# Patient Record
Sex: Female | Born: 1940 | Race: White | Hispanic: No | Smoking: Never smoker
Health system: Southern US, Community
[De-identification: ages and names within clinical notes are randomized; demographics above are authoritative.]

## PROBLEM LIST (undated history)

## (undated) DIAGNOSIS — Z86718 Personal history of other venous thrombosis and embolism: Secondary | ICD-10-CM

## (undated) DIAGNOSIS — C189 Malignant neoplasm of colon, unspecified: Secondary | ICD-10-CM

## (undated) DIAGNOSIS — K219 Gastro-esophageal reflux disease without esophagitis: Secondary | ICD-10-CM

## (undated) DIAGNOSIS — I10 Essential (primary) hypertension: Secondary | ICD-10-CM

## (undated) DIAGNOSIS — E079 Disorder of thyroid, unspecified: Secondary | ICD-10-CM

## (undated) DIAGNOSIS — N390 Urinary tract infection, site not specified: Secondary | ICD-10-CM

## (undated) HISTORY — PX: COLON SURGERY: SHX602

## (undated) HISTORY — PX: ABDOMINAL HYSTERECTOMY: SHX81

---

## 2013-09-20 ENCOUNTER — Emergency Department (INDEPENDENT_AMBULATORY_CARE_PROVIDER_SITE_OTHER)
Admission: EM | Admit: 2013-09-20 | Discharge: 2013-09-20 | Disposition: A | Discharge status: Home / Self Care | Payer: Medicare Other | Source: Home / Self Care | Attending: Family Medicine | Admitting: Family Medicine

## 2013-09-20 ENCOUNTER — Encounter: Payer: Self-pay | Admitting: Emergency Medicine

## 2013-09-20 DIAGNOSIS — N3000 Acute cystitis without hematuria: Secondary | ICD-10-CM

## 2013-09-20 HISTORY — DX: Personal history of other venous thrombosis and embolism: Z86.718

## 2013-09-20 HISTORY — DX: Malignant neoplasm of colon, unspecified: C18.9

## 2013-09-20 HISTORY — DX: Disorder of thyroid, unspecified: E07.9

## 2013-09-20 HISTORY — DX: Essential (primary) hypertension: I10

## 2013-09-20 LAB — POCT URINALYSIS DIP (MANUAL ENTRY)
Bilirubin, UA: NEGATIVE
Glucose, UA: NEGATIVE
Ketones, POC UA: NEGATIVE
Nitrite, UA: POSITIVE
Spec Grav, UA: >=1.03 (ref 1.005–1.03)

## 2013-09-20 MED ORDER — CIPROFLOXACIN HCL 500 MG PO TABS: 500.0000 mg | ORAL_TABLET | Freq: Two times a day (BID) | ORAL | Status: DC

## 2013-09-20 NOTE — ED Notes (Signed)
Patient c/o frequent urination, dysuria and burning with urination x 4 days. Patient has tried OTC AZO patient also states she gets UTIs frequent.

## 2013-09-20 NOTE — ED Provider Notes (Signed)
CSN: [id number]     Arrival date & time 09/20/13  1656 History   First MD Initiated Contact with Patient 09/20/13 1722     Chief Complaint  Patient presents with  . Dysuria  . Urinary Frequency      HPI Comments: Patient complains of four day history of dysuria, urgency, frequency, hesitancy, and nocturia.  No fevers, chills, and sweats.  No abdominal pain.  She states that she gets a UTI about every 3 months.  Her last UTI was treated with Cipro, which she states seems to work the best.  Her symptoms have been improved with OTC AZO.  Patient is a 72 y.o. female presenting with dysuria. The history is provided by the patient.  Dysuria Pain quality:  Burning Pain severity:  Mild Onset quality:  Sudden Duration:  4 days Timing:  Constant Progression:  Worsening Chronicity:  Recurrent Recent urinary tract infections: yes   Relieved by:  Phenazopyridine Worsened by:  Nothing tried Ineffective treatments:  None tried Urinary symptoms: frequent urination and hesitancy   Urinary symptoms: no discolored urine, no foul-smelling urine, no hematuria and no bladder incontinence   Associated symptoms: no abdominal pain, no fever, no flank pain, no genital lesions, no nausea, no vaginal discharge and no vomiting   Risk factors: recurrent urinary tract infections     Past Medical History  Diagnosis Date  . Hypertension   . Thyroid disease   . H/O blood clots   . Colon cancer    Past Surgical History  Procedure Laterality Date  . Abdominal hysterectomy    . Colon surgery     History reviewed. No pertinent family history. History  Substance Use Topics  . Smoking status: Never Smoker   . Smokeless tobacco: Never Used  . Alcohol Use: No   OB History   Grav Para Term Preterm Abortions TAB SAB Ect Mult Living                 Review of Systems  Constitutional: Negative for fever.  Gastrointestinal: Negative for nausea, vomiting and abdominal pain.  Genitourinary: Positive for  dysuria. Negative for flank pain and vaginal discharge.  All other systems reviewed and are negative.    Allergies  Review of patient's allergies indicates no known allergies.  Home Medications   Current Outpatient Rx  Name  Route  Sig  Dispense  Refill  . ciprofloxacin (CIPRO) 500 MG tablet   Oral   Take 1 tablet (500 mg total) by mouth 2 (two) times daily.   14 tablet   0    BP 118/78  Pulse 89  Temp(Src) 98.1 F (36.7 C) (Oral)  Resp 18  Ht 5' 4.5" (1.638 m)  Wt 240 lb (108.863 kg)  BMI 40.57 kg/m2  SpO2 97% Physical Exam Nursing notes and Vital Signs reviewed. Appearance:  Patient appears stated age, and in no acute distress.  Patient is obese (BMI 40.6) Eyes:  Pupils are equal, round, and reactive to light and accomodation.  Extraocular movement is intact.  Conjunctivae are not inflamed  Pharynx:  Normal; moist mucous membranes  Neck:  Supple.   No adenopathy Lungs:  Clear to auscultation.  Breath sounds are equal.  Heart:  Regular rate and rhythm without murmurs, rubs, or gallops.  Abdomen:  Nontender without masses or hepatosplenomegaly.  Bowel sounds are present.  No CVA or flank tenderness.  Colostomy in place Extremities:  No edema.  No calf tenderness Skin:  No rash present.   ED  Course  Procedures  none    Labs Reviewed  URINE CULTURE  POCT URINALYSIS DIP (MANUAL ENTRY): SG >= 1.030; BLO moderate; PRO 100mg /dL; NIT positive; LEU moderate         MDM   1. Acute cystitis    Urine culture pending.  Begin Cipro.  May continue AZO for 1 to 2 days. Followup with PCP if not improving one week. If symptoms become significantly worse during the night or over the weekend, proceed to the local emergency room.     Lattie Haw, MD 09/21/13 [phone]

## 2013-09-22 LAB — URINE CULTURE: Colony Count: >=100000

## 2013-09-23 ENCOUNTER — Telehealth: Payer: Self-pay | Admitting: *Deleted

## 2016-04-06 ENCOUNTER — Encounter: Payer: Self-pay | Admitting: Emergency Medicine

## 2016-04-06 ENCOUNTER — Emergency Department (INDEPENDENT_AMBULATORY_CARE_PROVIDER_SITE_OTHER)
Admission: EM | Admit: 2016-04-06 | Discharge: 2016-04-06 | Disposition: A | Discharge status: Home / Self Care | Payer: Medicare Other | Source: Home / Self Care | Attending: Family Medicine | Admitting: Family Medicine

## 2016-04-06 DIAGNOSIS — N390 Urinary tract infection, site not specified: Secondary | ICD-10-CM

## 2016-04-06 HISTORY — DX: Urinary tract infection, site not specified: N39.0

## 2016-04-06 HISTORY — DX: Gastro-esophageal reflux disease without esophagitis: K21.9

## 2016-04-06 LAB — POCT URINALYSIS DIP (MANUAL ENTRY)
Glucose, UA: NEGATIVE
Nitrite, UA: POSITIVE — AB
Protein Ur, POC: 100 — AB
Spec Grav, UA: >=1.03 (ref 1.005–1.03)
Urobilinogen, UA: 0.2 (ref 0–1)
pH, UA: 5.5 (ref 5–8)

## 2016-04-06 MED ORDER — NITROFURANTOIN MONOHYD MACRO 100 MG PO CAPS: 100.0000 mg | ORAL_CAPSULE | Freq: Two times a day (BID) | ORAL | Status: DC

## 2016-04-06 NOTE — ED Provider Notes (Signed)
CSN: [id number]     Arrival date & time 04/06/16  1937 History   First MD Initiated Contact with Patient 04/06/16 1957     Chief Complaint  Patient presents with  . Urinary Frequency  . Dysuria   (Consider location/radiation/quality/duration/timing/severity/associated sxs/prior Treatment) HPI  Tara Fitzgerald is a 75 y.o. female presenting to UC with c/o UTI symptoms including urinary frequency, urgency, and discomfort with mild lower back pain for the last 3 days. Hx of recurrent UTIs but has not had one for about 7-8 months after being started on a maintenance dose of keflex 250mg .  Pt notes when she noticed symptoms, she started to double the dose of her antibiotic but no relief. Symptoms are mild to moderate in severity.  Denies fever, chills, n/v/d.    Past Medical History  Diagnosis Date  . Hypertension   . Thyroid disease   . H/O blood clots   . Colon cancer ([place])   . GERD (gastroesophageal reflux disease)   . Frequent UTI    Past Surgical History  Procedure Laterality Date  . Abdominal hysterectomy    . Colon surgery     History reviewed. No pertinent family history. Social History  Substance Use Topics  . Smoking status: Never Smoker   . Smokeless tobacco: Never Used  . Alcohol Use: No   OB History    No data available     Review of Systems  Constitutional: Negative for fever and chills.  Gastrointestinal: Negative for nausea, vomiting, abdominal pain and diarrhea.  Genitourinary: Positive for dysuria, urgency and frequency. Negative for hematuria, flank pain and pelvic pain.  Musculoskeletal: Positive for back pain ( lower). Negative for myalgias.  Neurological: Negative for dizziness, light-headedness and headaches.    Allergies  Review of patient's allergies indicates no known allergies.  Home Medications   Prior to Admission medications   Medication Sig Start Date End Date Taking? Authorizing Provider  levothyroxine (SYNTHROID, LEVOTHROID) 150 MCG tablet  Take 150 mcg by mouth daily before breakfast.   Yes Historical Provider, MD  omeprazole (PRILOSEC) 20 MG capsule Take 20 mg by mouth daily.   Yes Historical Provider, MD  warfarin (COUMADIN) 6 MG tablet Take 6 mg by mouth daily.   Yes Historical Provider, MD  ciprofloxacin (CIPRO) 500 MG tablet Take 1 tablet (500 mg total) by mouth 2 (two) times daily. 09/20/13   Kandra Nicolas, MD  nitrofurantoin, macrocrystal-monohydrate, (MACROBID) 100 MG capsule Take 1 capsule (100 mg total) by mouth 2 (two) times daily. 04/06/16   Noland Fordyce, PA-C   Meds Ordered and Administered this Visit  Medications - No data to display  BP 128/79 mmHg  Pulse 85  Temp(Src) 97.8 F (36.6 C) (Oral)  Resp 16  Ht 5\' 4"  (1.626 m)  Wt 238 lb (107.956 kg)  BMI 40.83 kg/m2  SpO2 98% No data found.   Physical Exam  Constitutional: She is oriented to person, place, and time. She appears well-developed and well-nourished. No distress.  HENT:  Head: Normocephalic and atraumatic.  Mouth/Throat: Oropharynx is clear and moist.  Eyes: Conjunctivae are normal. No scleral icterus.  Neck: Normal range of motion.  Cardiovascular: Normal rate, regular rhythm and normal heart sounds.   Pulmonary/Chest: Effort normal and breath sounds normal. No respiratory distress. She has no wheezes. She has no rales. She exhibits no tenderness.  Abdominal: Soft. Bowel sounds are normal. She exhibits no distension and no mass. There is no tenderness. There is no rebound, no guarding and  no CVA tenderness.  Musculoskeletal: Normal range of motion.  Neurological: She is alert and oriented to person, place, and time.  Skin: Skin is warm and dry. She is not diaphoretic.  Nursing note and vitals reviewed.   ED Course  Procedures (including critical care time)  Labs Review Labs Reviewed  POCT URINALYSIS DIP (MANUAL ENTRY) - Abnormal; Notable for the following:    Color, UA straw (*)    Clarity, UA hazy (*)    Bilirubin, UA small (*)     Ketones, POC UA trace (5) (*)    Blood, UA moderate (*)    Protein Ur, POC =100 (*)    Nitrite, UA Positive (*)    Leukocytes, UA moderate (2+) (*)    All other components within normal limits  URINE CULTURE    Imaging Review No results found.    MDM   1. UTI (lower urinary tract infection)    Pt c/o urinary symptoms for 3 days. Hx of recurrent UTIs. Currently on 250mg  maintenance dose of Keflex.  She has already tried to self-medicate by doubling the dose but no relief of symptoms.  Pt is currently on Warfarin, cannot give Bactrim or Cipro as both are contraindicated.    Will give macrobid. Encouraged close f/u with PCP.      Noland Fordyce, PA-C 04/06/16 2035

## 2016-04-06 NOTE — ED Notes (Signed)
Reports flair of UTI symptoms over past 3 days; on maintenance dose antibiotic for past 7-26months for frequent UTIs.

## 2016-04-09 LAB — URINE CULTURE: Colony Count: >=100000

## 2016-04-10 ENCOUNTER — Telehealth: Payer: Self-pay | Admitting: *Deleted

## 2016-04-10 NOTE — ED Notes (Signed)
Urine culture:  Enterobacter cloacae, intermediate sensitivity to nitrofurantoin. Continue Macrobid if improving.  Kandra Nicolas, MD 04/10/16 1113

## 2016-04-10 NOTE — ED Notes (Signed)
Callback: Pt reports she is seeing much more improvement in her symptoms today. Per Dr. Assunta Found, if improving continue Macrobid based on UCX. Encouraged to f/u with PCP.

## 2018-08-18 ENCOUNTER — Encounter: Payer: Self-pay | Admitting: *Deleted

## 2018-08-18 ENCOUNTER — Other Ambulatory Visit: Payer: Self-pay

## 2018-08-18 ENCOUNTER — Emergency Department (INDEPENDENT_AMBULATORY_CARE_PROVIDER_SITE_OTHER): Payer: Medicare Other

## 2018-08-18 ENCOUNTER — Emergency Department (INDEPENDENT_AMBULATORY_CARE_PROVIDER_SITE_OTHER)
Admission: EM | Admit: 2018-08-18 | Discharge: 2018-08-18 | Disposition: A | Discharge status: Home / Self Care | Payer: Medicare Other | Source: Home / Self Care | Attending: Family Medicine | Admitting: Family Medicine

## 2018-08-18 DIAGNOSIS — R05 Cough: Secondary | ICD-10-CM

## 2018-08-18 DIAGNOSIS — N3001 Acute cystitis with hematuria: Secondary | ICD-10-CM

## 2018-08-18 DIAGNOSIS — R3915 Urgency of urination: Secondary | ICD-10-CM

## 2018-08-18 DIAGNOSIS — J069 Acute upper respiratory infection, unspecified: Secondary | ICD-10-CM

## 2018-08-18 DIAGNOSIS — B9789 Other viral agents as the cause of diseases classified elsewhere: Secondary | ICD-10-CM

## 2018-08-18 LAB — POCT URINALYSIS DIP (MANUAL ENTRY)
Bilirubin, UA: NEGATIVE
Glucose, UA: NEGATIVE mg/dL
Ketones, POC UA: NEGATIVE mg/dL
Nitrite, UA: NEGATIVE
Protein Ur, POC: 30 mg/dL — AB
Spec Grav, UA: 1.025 (ref 1.010–1.025)
Urobilinogen, UA: 0.2 E.U./dL
pH, UA: 5.5 (ref 5.0–8.0)

## 2018-08-18 MED ORDER — BENZONATATE 100 MG PO CAPS: 100.0000 mg | ORAL_CAPSULE | Freq: Three times a day (TID) | ORAL | 0 refills | Status: AC

## 2018-08-18 MED ORDER — CIPROFLOXACIN HCL 250 MG PO TABS: 250.0000 mg | ORAL_TABLET | Freq: Two times a day (BID) | ORAL | 0 refills | Status: AC

## 2018-08-18 NOTE — ED Provider Notes (Signed)
[facility]    CSN: [id number] Arrival date & time: 08/18/18  1542     History   Chief Complaint Chief Complaint  Patient presents with  . Cough    HPI Tara Fitzgerald is a 77 y.o. female.   HPI  Tara Fitzgerald is a 77 y.o. female presenting to UC with c/o productive cough for 3 days.  Hx of pneumonia 4 years ago. She believes her cough and congestion is "just a cold" but with the cough gradually worsening, she wants to make sure she does not have pneumonia again.  She is also c/o 4 days of worsening urinary urgency.  She completed a course of keflex 3 weeks ago for a UTI but does not believe her symptoms completely resolved. Hx of frequent UTIs. Denies fever, chills, n/v/d.    Past Medical History:  Diagnosis Date  . Colon cancer ([place])   . Frequent UTI   . GERD (gastroesophageal reflux disease)   . H/O blood clots   . Hypertension   . Thyroid disease     There are no active problems to display for this patient.   Past Surgical History:  Procedure Laterality Date  . ABDOMINAL HYSTERECTOMY    . COLON SURGERY      OB History   None      Home Medications    Prior to Admission medications   Medication Sig Start Date End Date Taking? Authorizing Provider  albuterol (VENTOLIN HFA) 108 (90 Base) MCG/ACT inhaler INHALE 1 TO 2 PUFFS BY MOUTH INTO THE LUNGS EVERY 4 HOURS AS NEEDED FOR WHEEZING OR SHORTNESS OF BREATH 11/18/16  Yes [provider]  cephALEXin (KEFLEX) 250 MG capsule Take by mouth. 07/21/18  Yes [provider]  levothyroxine (SYNTHROID, LEVOTHROID) 150 MCG tablet TAKE 1 TABLET EVERY DAY 02/01/14  Yes [provider]  omeprazole (PRILOSEC) 20 MG capsule TAKE 1 CAPSULE EVERY DAY 01/30/14  Yes [provider]  warfarin (COUMADIN) 1 MG tablet TAKE AS DIRECTED. 09/10/17  Yes [provider]  benzonatate (TESSALON) 100 MG capsule Take 1-2 capsules (100-200 mg total) by mouth every 8 (eight) hours. 08/18/18    Noe Gens, PA-C  ciprofloxacin (CIPRO) 250 MG tablet Take 1 tablet (250 mg total) by mouth every 12 (twelve) hours for 5 days. 08/18/18 08/23/18  Noe Gens, PA-C    Family History History reviewed. No pertinent family history.  Social History Social History   Tobacco Use  . Smoking status: Never Smoker  . Smokeless tobacco: Never Used  Substance Use Topics  . Alcohol use: No  . Drug use: No     Allergies   Patient has no known allergies.   Review of Systems Review of Systems  Constitutional: Negative for chills and fever.  HENT: Positive for congestion, postnasal drip and rhinorrhea. Negative for ear pain, sinus pressure, sinus pain and sore throat.   Respiratory: Positive for cough. Negative for chest tightness and shortness of breath.   Gastrointestinal: Negative for abdominal pain, diarrhea, nausea and vomiting.  Genitourinary: Positive for decreased urine volume, dysuria, frequency and urgency. Negative for hematuria and pelvic pain.  Musculoskeletal: Negative for back pain and myalgias.  Neurological: Negative for dizziness, light-headedness and headaches.     Physical Exam Triage Vital Signs ED Triage Vitals [08/18/18 1610]  Enc Vitals Group     BP (!) 149/83     Pulse Rate 93     Resp 18     Temp (!) 97.5 F (  36.4 C)     Temp Source Oral     SpO2 97 %     Weight 258 lb (117 kg)     Height 5' 4.5" (1.638 m)     Head Circumference      Peak Flow      Pain Score 0     Pain Loc      Pain Edu?      Excl. in [place]?    No data found.  Updated Vital Signs BP (!) 149/83 (BP Location: Right Arm)   Pulse 93   Temp (!) 97.5 F (36.4 C) (Oral)   Resp 18   Ht 5' 4.5" (1.638 m)   Wt 258 lb (117 kg)   SpO2 97%   BMI 43.60 kg/m   Visual Acuity Right Eye Distance:   Left Eye Distance:   Bilateral Distance:    Right Eye Near:   Left Eye Near:    Bilateral Near:     Physical Exam  Constitutional: She is oriented to person, place, and time. She  appears well-developed and well-nourished. No distress.  HENT:  Head: Normocephalic and atraumatic.  Right Ear: Tympanic membrane normal.  Left Ear: Tympanic membrane normal.  Nose: Nose normal. Right sinus exhibits no maxillary sinus tenderness and no frontal sinus tenderness. Left sinus exhibits no maxillary sinus tenderness and no frontal sinus tenderness.  Mouth/Throat: Uvula is midline, oropharynx is clear and moist and mucous membranes are normal.  Eyes: EOM are normal.  Neck: Normal range of motion.  Cardiovascular: Normal rate and regular rhythm.  Pulmonary/Chest: Effort normal and breath sounds normal. No stridor. No respiratory distress. She has no wheezes. She has no rales.  Mild intermittent productive cough during exam  Abdominal: Soft. She exhibits no distension. There is no tenderness. There is no CVA tenderness.  Musculoskeletal: Normal range of motion.  Neurological: She is alert and oriented to person, place, and time.  Skin: Skin is warm and dry. She is not diaphoretic.  Psychiatric: She has a normal mood and affect. Her behavior is normal.  Nursing note and vitals reviewed.    UC Treatments / Results  Labs (all labs ordered are listed, but only abnormal results are displayed) Labs Reviewed  POCT URINALYSIS DIP (MANUAL ENTRY) - Abnormal; Notable for the following components:      Result Value   Clarity, UA cloudy (*)    Blood, UA moderate (*)    Protein Ur, POC =30 (*)    Leukocytes, UA Large (3+) (*)    All other components within normal limits  URINE CULTURE    EKG None  Radiology Dg Chest 2 View  Result Date: 08/18/2018 CLINICAL DATA:  Cough 3 days EXAM: CHEST - 2 VIEW COMPARISON:  None. FINDINGS: The heart size and mediastinal contours are within normal limits. Both lungs are clear. The visualized skeletal structures are unremarkable. IMPRESSION: No active cardiopulmonary disease. Electronically Signed   By: Franchot Gallo M.D.   On: 08/18/2018 16:52      Procedures Procedures (including critical care time)  Medications Ordered in UC Medications - No data to display  Initial Impression / Assessment and Plan / UC Course  I have reviewed the triage vital signs and the nursing notes.  Pertinent labs & imaging results that were available during my care of the patient were reviewed by me and considered in my medical decision making (see chart for details).     Reassured pt of normal CXR, no pneumonia. Will start  pt on Cipro based on prior urine cultures. Pt is on warfarin. She has taken cipro in the past w/o reaction and understands she needs close f/u due to being on warfarin.  Final Clinical Impressions(s) / UC Diagnoses   Final diagnoses:  Urgency of urination  Viral URI with cough  Acute cystitis with hematuria     Discharge Instructions      Please call your family doctor tomorrow and let them know you were started on ciprofloxacin for your urinary infection.  It appears to be the only good antibiotic choice for your urinary infection today.   This medication may mean that your warfarin dose needs to be lowered.    Please follow with family medicine this week for recheck of symptoms.     ED Prescriptions    Medication Sig Dispense Auth. Provider   benzonatate (TESSALON) 100 MG capsule Take 1-2 capsules (100-200 mg total) by mouth every 8 (eight) hours. 21 capsule Leeroy Cha O, PA-C   ciprofloxacin (CIPRO) 250 MG tablet Take 1 tablet (250 mg total) by mouth every 12 (twelve) hours for 5 days. 10 tablet Noe Gens, PA-C     Controlled Substance Prescriptions [place] Controlled Substance Registry consulted? Not Applicable   Tyrell Antonio 08/19/18 1132

## 2018-08-18 NOTE — Discharge Instructions (Signed)
°  Please call your family doctor tomorrow and let them know you were started on ciprofloxacin for your urinary infection.  It appears to be the only good antibiotic choice for your urinary infection today.   This medication may mean that your warfarin dose needs to be lowered.    Please follow with family medicine this week for recheck of symptoms.

## 2018-08-18 NOTE — ED Triage Notes (Signed)
Pt c/o productive cough x 3 days. No OTC meds. Denies fever. Hx of pneumonia 4 years ago. She also c/o urinary urgency x 4 days. She takes keflex 250mg  QD preventive and she recently completed a course of keflex 3 wks for a UTI while she was out of town in [place].

## 2018-08-21 ENCOUNTER — Telehealth: Payer: Self-pay | Admitting: Emergency Medicine

## 2018-08-21 NOTE — Telephone Encounter (Signed)
UCX post for bacteria, finish meds, drink lots of water. She is feeling better

## 2018-08-22 LAB — URINE CULTURE
MICRO NUMBER:: 91294294
SPECIMEN QUALITY:: ADEQUATE

## 2020-01-31 IMAGING — DX DG CHEST 2V
2 series · 2 of 2 positions shown · non-contrast
Comparison: None.

CLINICAL DATA: Cough 3 days

EXAM:
CHEST - 2 VIEW

[chest pa]
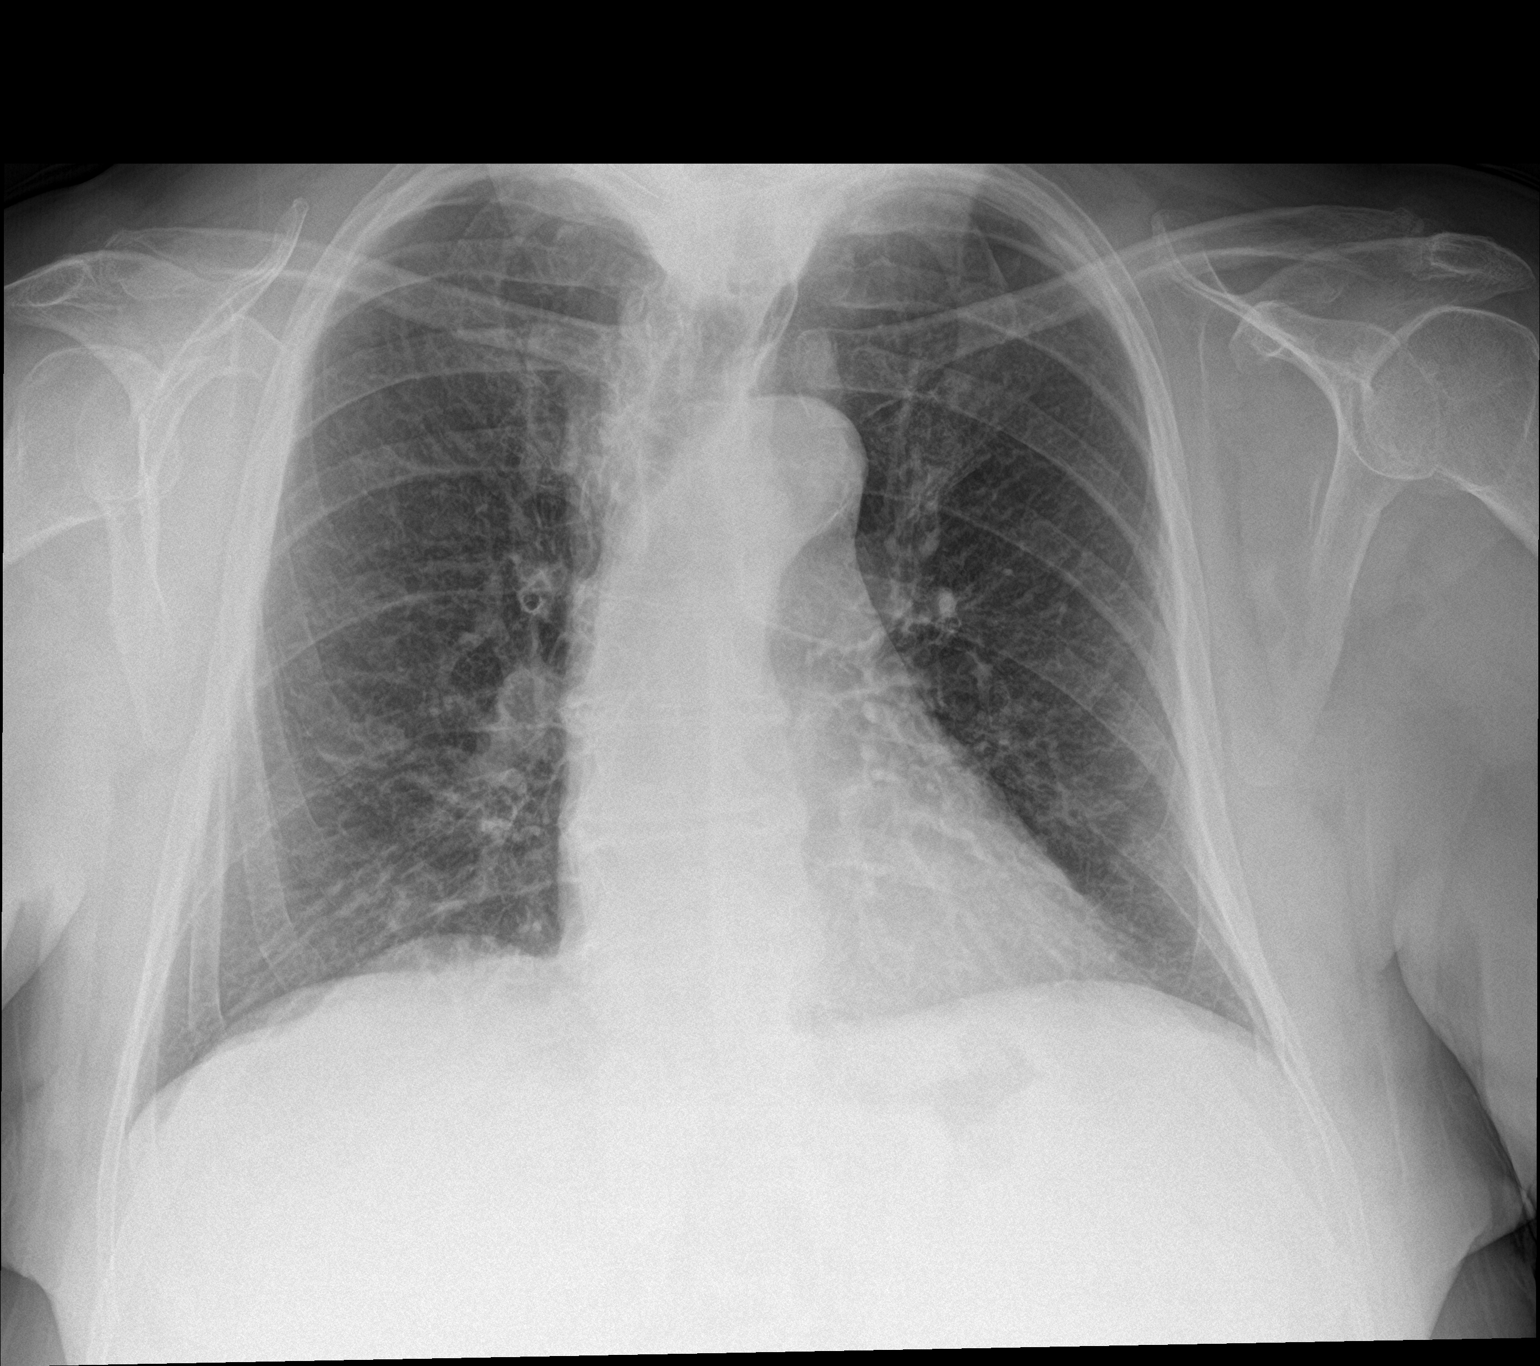

[chest lat]
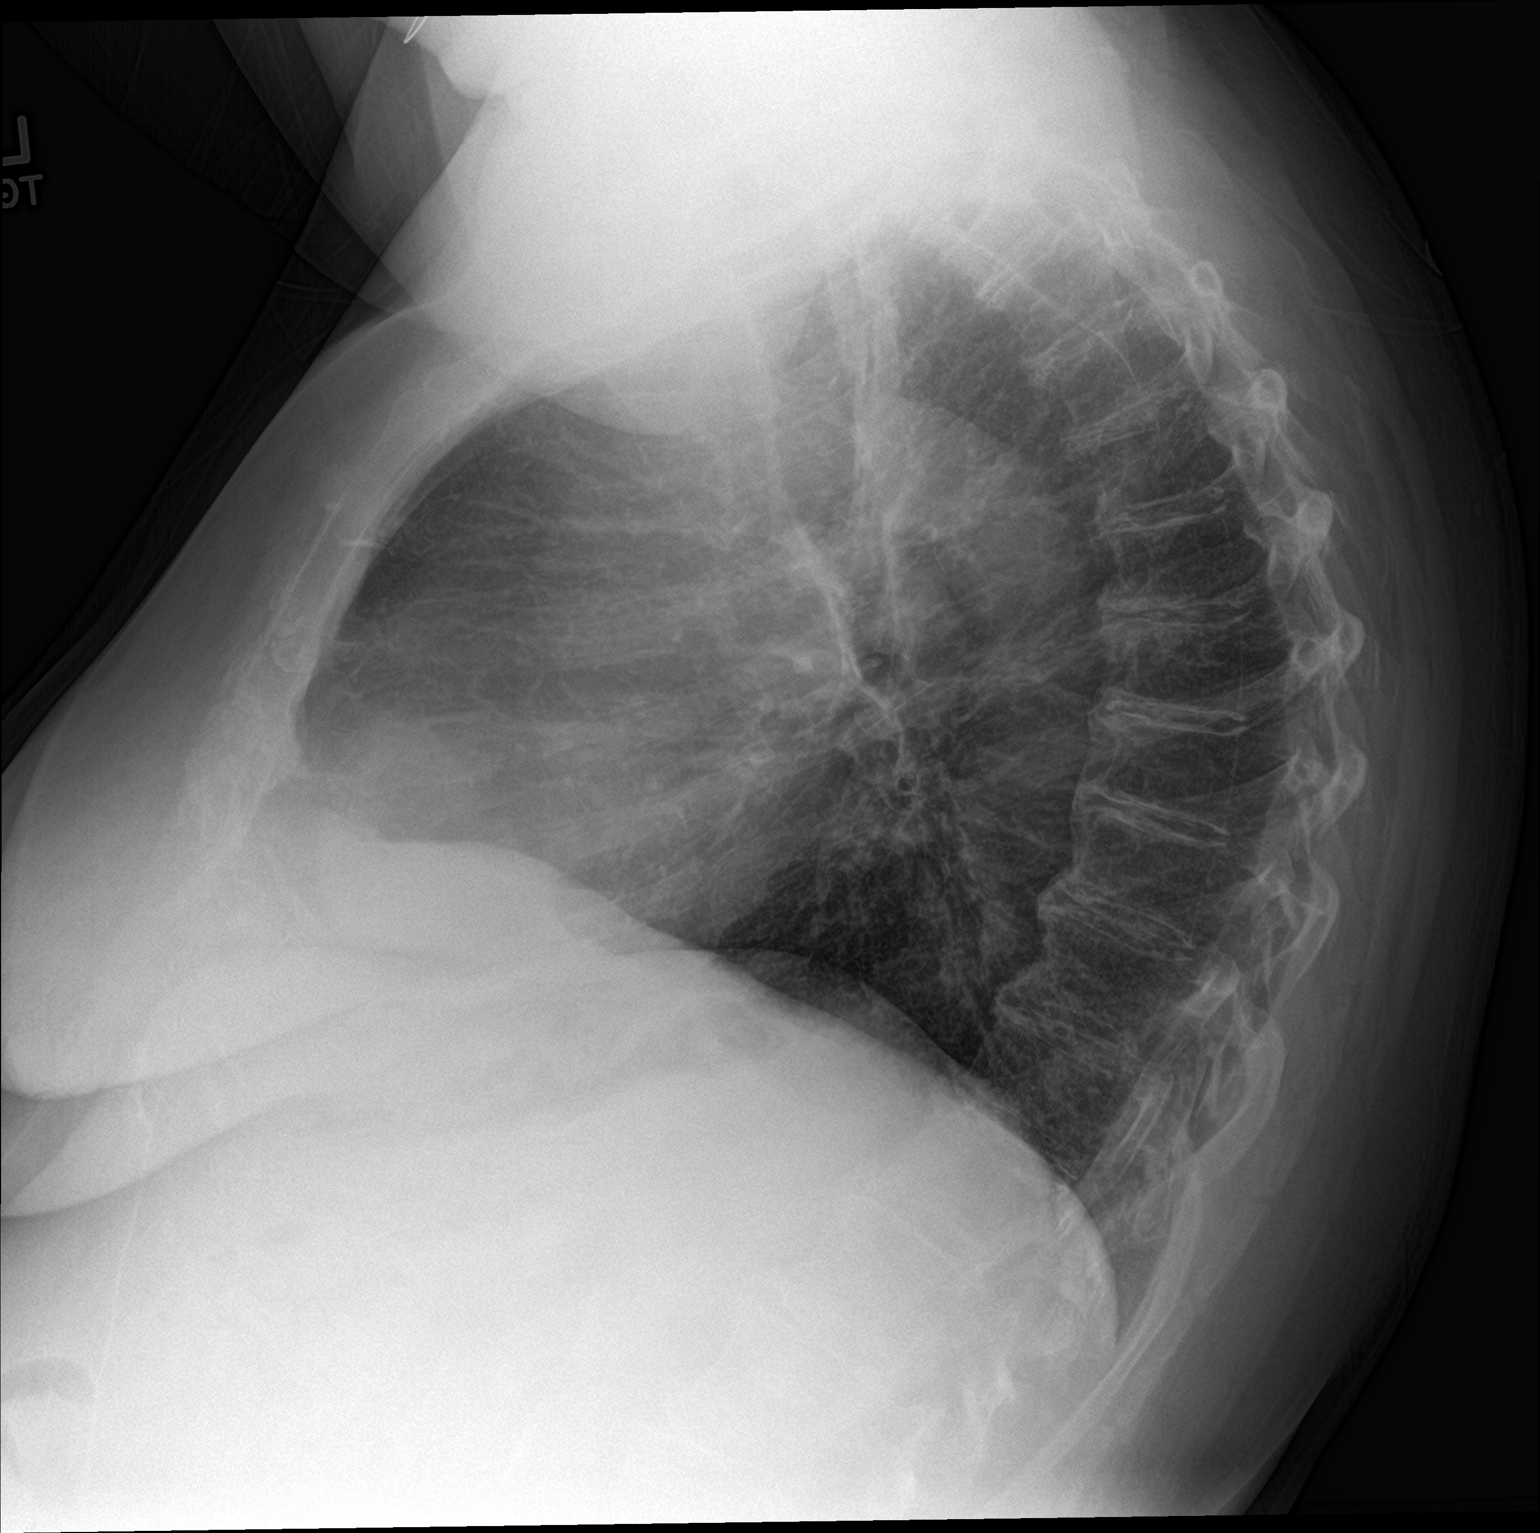

[2 of 2 positions shown; findings below may reference images not displayed]

FINDINGS: The heart size and mediastinal contours are within normal limits.
Both lungs are clear. The visualized skeletal structures are
unremarkable.
IMPRESSION: No active cardiopulmonary disease.
